# Patient Record
Sex: Female | Born: 1945 | Race: White | Hispanic: No | Marital: Married | Smoking: Former smoker
Health system: Southern US, Community
[De-identification: ages and names within clinical notes are randomized; demographics above are authoritative.]

## PROBLEM LIST (undated history)

## (undated) DIAGNOSIS — I10 Essential (primary) hypertension: Secondary | ICD-10-CM

## (undated) DIAGNOSIS — I219 Acute myocardial infarction, unspecified: Secondary | ICD-10-CM

## (undated) DIAGNOSIS — E079 Disorder of thyroid, unspecified: Secondary | ICD-10-CM

## (undated) DIAGNOSIS — I739 Peripheral vascular disease, unspecified: Secondary | ICD-10-CM

## (undated) DIAGNOSIS — I509 Heart failure, unspecified: Secondary | ICD-10-CM

## (undated) DIAGNOSIS — I639 Cerebral infarction, unspecified: Secondary | ICD-10-CM

## (undated) DIAGNOSIS — E119 Type 2 diabetes mellitus without complications: Secondary | ICD-10-CM

## (undated) DIAGNOSIS — S42309A Unspecified fracture of shaft of humerus, unspecified arm, initial encounter for closed fracture: Secondary | ICD-10-CM

## (undated) DIAGNOSIS — I2510 Atherosclerotic heart disease of native coronary artery without angina pectoris: Secondary | ICD-10-CM

## (undated) DIAGNOSIS — E785 Hyperlipidemia, unspecified: Secondary | ICD-10-CM

## (undated) DIAGNOSIS — J449 Chronic obstructive pulmonary disease, unspecified: Secondary | ICD-10-CM

## (undated) DIAGNOSIS — Z9861 Coronary angioplasty status: Secondary | ICD-10-CM

## (undated) HISTORY — PX: OTHER SURGICAL HISTORY: SHX169

## (undated) HISTORY — PX: ABDOMINAL HYSTERECTOMY: SHX81

## (undated) HISTORY — PX: BUNIONECTOMY: SHX129

## (undated) HISTORY — DX: Unspecified fracture of shaft of humerus, unspecified arm, initial encounter for closed fracture: S42.309A

## (undated) HISTORY — DX: Type 2 diabetes mellitus without complications: E11.9

## (undated) HISTORY — DX: Disorder of thyroid, unspecified: E07.9

## (undated) HISTORY — DX: Essential (primary) hypertension: I10

## (undated) HISTORY — PX: CARPAL TUNNEL RELEASE: SHX101

## (undated) HISTORY — DX: Coronary angioplasty status: Z98.61

## (undated) HISTORY — DX: Cerebral infarction, unspecified: I63.9

## (undated) HISTORY — DX: Peripheral vascular disease, unspecified: I73.9

## (undated) HISTORY — DX: Atherosclerotic heart disease of native coronary artery without angina pectoris: I25.10

## (undated) HISTORY — DX: Acute myocardial infarction, unspecified: I21.9

## (undated) HISTORY — PX: ANTERIOR CRUCIATE LIGAMENT REPAIR: SHX115

## (undated) HISTORY — DX: Hyperlipidemia, unspecified: E78.5

## (undated) HISTORY — DX: Heart failure, unspecified: I50.9

## (undated) HISTORY — DX: Chronic obstructive pulmonary disease, unspecified: J44.9

---

## 1993-10-10 DIAGNOSIS — Z9861 Coronary angioplasty status: Secondary | ICD-10-CM

## 1993-10-10 HISTORY — DX: Coronary angioplasty status: Z98.61

## 1994-04-13 DIAGNOSIS — E079 Disorder of thyroid, unspecified: Secondary | ICD-10-CM

## 1994-04-13 HISTORY — DX: Disorder of thyroid, unspecified: E07.9

## 1994-04-13 HISTORY — PX: FEMORAL BYPASS: SHX50

## 2000-01-11 DIAGNOSIS — I639 Cerebral infarction, unspecified: Secondary | ICD-10-CM

## 2000-01-11 HISTORY — DX: Cerebral infarction, unspecified: I63.9

## 2006-07-31 ENCOUNTER — Ambulatory Visit: Payer: Self-pay | Admitting: Vascular Surgery

## 2006-08-14 ENCOUNTER — Ambulatory Visit (HOSPITAL_COMMUNITY): Admission: RE | Admit: 2006-08-14 | Discharge: 2006-08-14 | Payer: Self-pay | Admitting: Vascular Surgery

## 2006-08-14 ENCOUNTER — Ambulatory Visit: Payer: Self-pay | Admitting: Vascular Surgery

## 2006-08-14 HISTORY — PX: OTHER SURGICAL HISTORY: SHX169

## 2006-09-08 ENCOUNTER — Ambulatory Visit: Payer: Self-pay | Admitting: Vascular Surgery

## 2006-09-27 ENCOUNTER — Ambulatory Visit (HOSPITAL_BASED_OUTPATIENT_CLINIC_OR_DEPARTMENT_OTHER): Admission: RE | Admit: 2006-09-27 | Discharge: 2006-09-27 | Payer: Self-pay | Admitting: Orthopedic Surgery

## 2006-12-04 ENCOUNTER — Ambulatory Visit: Payer: Self-pay | Admitting: Vascular Surgery

## 2007-04-06 ENCOUNTER — Ambulatory Visit: Payer: Self-pay | Admitting: Vascular Surgery

## 2007-10-02 ENCOUNTER — Ambulatory Visit: Payer: Self-pay | Admitting: Vascular Surgery

## 2008-03-19 ENCOUNTER — Ambulatory Visit: Payer: Self-pay | Admitting: Vascular Surgery

## 2008-09-22 ENCOUNTER — Ambulatory Visit: Payer: Self-pay | Admitting: Vascular Surgery

## 2009-03-26 ENCOUNTER — Ambulatory Visit: Payer: Self-pay | Admitting: Vascular Surgery

## 2010-03-02 ENCOUNTER — Encounter (INDEPENDENT_AMBULATORY_CARE_PROVIDER_SITE_OTHER)

## 2010-03-02 ENCOUNTER — Other Ambulatory Visit (INDEPENDENT_AMBULATORY_CARE_PROVIDER_SITE_OTHER)

## 2010-03-02 ENCOUNTER — Ambulatory Visit (INDEPENDENT_AMBULATORY_CARE_PROVIDER_SITE_OTHER): Admitting: Vascular Surgery

## 2010-03-02 DIAGNOSIS — I70219 Atherosclerosis of native arteries of extremities with intermittent claudication, unspecified extremity: Secondary | ICD-10-CM

## 2010-03-02 DIAGNOSIS — Z48812 Encounter for surgical aftercare following surgery on the circulatory system: Secondary | ICD-10-CM

## 2010-03-03 NOTE — Consult Note (Signed)
NEW PATIENT CONSULTATION  [name], [name] DOB:  [birth year]                                       03/02/2010 ZOXWR#:[id number]  The patient presents today for followup on her prior lower extremity revascularization.  She is status post right fem-pop bypass by myself in 1996 and common iliac and external iliac artery stenting by Dr. Edilia Bo in 2008.  She has upcoming left foot surgery planned and we are seeing her for further discussion of this.  She is very stable with claudication symptoms and no tissue loss.  She does walk without claudication.  PAST HISTORY:  Significant for 28 years insulin-dependent diabetes, hypertension, prior myocardial infarction 20 years ago.  PAST SURGICAL HISTORY:  Rotator cuff, bunion surgery, ACL repair, hysterectomy, carpal tunnel release in both hands.  SOCIAL HISTORY:  She is married with 1 child.  She is retired and works as a housewife.  She does smoke 1 pack of cigarettes per day.  Does not drink alcohol on a regular basis.  FAMILY HISTORY:  Negative for premature atherosclerotic disease.  REVIEW OF SYSTEMS:  Negative for weight loss or gain.  Weight is 160 pounds.  She is 5 feet 1 inches tall, otherwise negative except for HPI.  HISTORY OF PRESENT ILLNESS:  Vital signs:  Blood pressure 178/73, pulse 82, respirations 20.  She is in no acute distress.  HEENT:  Normal. Chest:  Clear bilaterally.  Heart:  Regular rate and rhythm.  Carotids are without bruits bilaterally.  She does have 2+ radial, 2+ femoral pulses.  She does have palpable dorsalis pedis pulses bilaterally. Abdomen:  Soft, nontender.  No masses noted.  Musculoskeletal:  Shows no major deformity or cyanosis.  Neurologic:  No focal weakness or paresthesias.  Skin:  Without ulcers or rashes.  She did undergo duplex evaluation in our office today.  This reveals patency of her fem-pop with no evidence of severe stenosis.  Ankle-arm index is stable at 0.94 on the  right and 0.85 on the left.  I am quite pleased with the result as is the patient and we will continue to follow her in our noninvasive vascular lab.  I do feel that she is safe to proceed with any surgery required on her left foot for the standpoint of her lower extremity arterial flow.  She will see [place] again in our vascular lab in 1 year.    Larina Earthly, M.D.  TFE/MEDQ  D:  03/02/2010  T:  03/03/2010  Job:  [id number]  cc:   Dr. Harvie Bridge, D.P.M.

## 2010-03-16 NOTE — Procedures (Unsigned)
AORTA-ILIAC DUPLEX EVALUATION  INDICATION:  Left common iliac and external iliac artery stents.  HISTORY: Diabetes:  Yes. Cardiac:  MI. Hypertension:  Yes. Smoking:  Yes. Previous Surgery:  Left common iliac and external iliac artery PTA/stents on 08/14/2006 by Dr. Edilia Bo, right fem-pop bypass graft in 1996 by Dr. Arbie Cookey.              SINGLE LEVEL ARTERIAL EXAM                             RIGHT                  LEFT Brachial:                  168                    166 Anterior tibial:           158                    143 Posterior tibial:          153                    129 Peroneal: Ankle/brachial index:      0.94                   0.85 Previous ABI/date:         03/26/2009, 0.92       03/26/2009, 0.74  AORTA-ILIAC DUPLEX EXAM Aorta - Proximal     81 cm/s Aorta - Mid          67 cm/s Aorta - Distal       74 cm/s  RIGHT                                   LEFT 207 cm/s          CIA-PROXIMAL          209 cm/s 172 cm/s          CIA-DISTAL            164 cm/s Not visualized    HYPOGASTRIC           Not visualized 171 cm/s          EIA-PROXIMAL          212 cm/s 216 cm/s          EIA-MID               169 cm/s 185 cm/s          EIA-DISTAL            167 cm/s  IMPRESSION: 1. Patent left common iliac and external iliac artery stents with     velocities of >200 cm/s noted in the bilateral common iliac and     external iliac arteries, as described above. 2. The right ankle brachial indices and biphasic Doppler waveforms     suggest a mildly decreased perfusion of the right lower extremity.     The left ankle brachial index and monophasic/biphasic Doppler     waveforms suggest mildly decreased perfusion of the left lower     extremity.  The bilateral ankle brachial indices appear stable when     compared to the previous examination.  ___________________________________________ Kristen Loader.  Early, M.D.  CH/MEDQ  D:  03/02/2010  T:  03/02/2010  Job:  [id number]

## 2010-03-16 NOTE — Procedures (Unsigned)
BYPASS GRAFT EVALUATION  INDICATION:  Right lower extremity bypass graft.  HISTORY: Diabetes:  Yes. Cardiac:  MI. Hypertension:  Yes. Smoking:  Yes. Previous Surgery:  Right fem-pop bypass graft in 1996 by Dr. Arbie Cookey, left common iliac and external iliac artery PTA/stents by Dr. Edilia Bo on 08/14/2006.  SINGLE LEVEL ARTERIAL EXAM                              RIGHT              LEFT Brachial:                    168                166 Anterior tibial:             158                143 Posterior tibial:            153                129 Peroneal: Ankle/brachial index:        0.94               0.85  PREVIOUS ABI:  Date: 03/26/2009  RIGHT:  0.92  LEFT:  0.74  LOWER EXTREMITY BYPASS GRAFT DUPLEX EXAM:  DUPLEX:  Biphasic Doppler waveforms noted throughout the right lower extremity bypass graft with a velocity of 226 cm/s noted in the right common femoral artery near the proximal anastomosis.  IMPRESSION: 1. Patent right femoropopliteal bypass graft with an elevated velocity     noted, as described above. 2. The right ankle brachial index and biphasic Doppler waveforms     suggest mildly decreased perfusion of the right lower extremity.     The left ankle brachial index and monophasic/biphasic Doppler     waveforms suggest mildly decreased perfusion of the left lower     extremity.  Bilateral ankle brachial indices appear stable when     compared to the previous examination.  ___________________________________________ Larina Earthly, M.D.  CH/MEDQ  D:  03/02/2010  T:  03/02/2010  Job:  [id number]

## 2010-05-25 NOTE — Procedures (Signed)
BYPASS GRAFT EVALUATION   INDICATION:  Follow up lower extremity  PVD.   HISTORY:  Diabetes:  Yes.  Cardiac:  Yes.  Hypertension:  Yes.  Smoking:  Yes.  Previous Surgery:  Status post right fem-pop bypass graft 04/13/1994 and  PTA/stent of left CIA and EIA 08/14/2006.   SINGLE LEVEL ARTERIAL EXAM                               RIGHT              LEFT  Brachial:                    166                150  Anterior tibial:             140                138  Posterior tibial:            162                138  Peroneal:  Ankle/brachial index:        0.92               0.83   PREVIOUS ABI:  Date: 09/08/2006  RIGHT:  0.94  LEFT:  0.84   LOWER EXTREMITY BYPASS GRAFT DUPLEX EXAM:  See attached sheet for  velocities.   DUPLEX:  Doppler  arterial-wave forms appear borderline  biphasic/triphasic proximal to within and distal to bypass graft.   IMPRESSION:  Bilateral ankle-brachial indices appear stable from  previous study.   Right femoral-popliteal bypass graft appears patent.   No significant changes from previous study.    ___________________________________________  Larina Earthly, M.D.   AS/MEDQ  D:  12/04/2006  T:  12/05/2006  Job:  [id number]

## 2010-05-25 NOTE — Op Note (Signed)
[name], [name]                ACCOUNT NO.:  [id number]   MEDICAL RECORD NO.:  [id number]          PATIENT TYPE:  AMB   LOCATION:  SDS                          FACILITY:  [facility]   PHYSICIAN:  Di Kindle. Edilia Bo, M.D.DATE OF BIRTH:  1945/10/22   DATE OF PROCEDURE:  08/14/2006  DATE OF DISCHARGE:                               OPERATIVE REPORT   PREOPERATIVE DIAGNOSIS:  Left lower extremity claudication with  peripheral vascular disease.   POSTOPERATIVE DIAGNOSIS:  Left lower extremity claudication with  peripheral vascular disease.   PROCEDURE:  1. Aortogram.  2. Bilateral iliac arteriogram.  3. Bilateral lower extremity runoff.  4. PTA and stent of the left common iliac artery and left external      iliac artery using a 8 x 60 Smart stent and then balloon dilatation      with a 7 x 40 Powerflex balloon x2.   SURGEON:  Di Kindle. Edilia Bo, M.D.   ANESTHESIA:  Local with sedation.   TECHNIQUE:  The patient was taken to the [facility] lab at [facility]. [facility] and received 1 mg of Versed and 50 mcg of fentanyl.  Both groins were prepped and draped in the usual sterile fashion.  After  the skin was infiltrated with 1% lidocaine, the left common femoral  artery was cannulated and a guidewire introduced into the infrarenal  aorta under fluoroscopic control.  A 5-French sheath was introduced over  the wire.  Pigtail catheter was then positioned at the L1 vertebral body  and flush aortogram obtained.  The catheter was then repositioned above  the aortic bifurcation and oblique iliac projections were obtained.  Bilateral lower extremity runoff films were obtained.  After successful  PTA and stenting of the left common iliac and external iliac artery,  additional runoff films were obtained on the left leg through the left  femoral sheath.  There were  no immediate postoperative complications.   On the left side, there was an 80% stenosis in the distal common iliac  artery and also a tandem 80% stenosis in the external iliac artery.  After this was identified, the 5-French sheath was exchanged for a long  6-French sheath and the patient received 3000 units of IV heparin.  An 8  x 60 Smart stent was positioned across the two tandem stenoses  incorporating both stenoses.  This was deployed.  Additional injection  was made that showed some residual stenosis within the stent.  I  therefore dilated within the stent with a 7 x 40 Powerflex balloon  proximally at 8 atmospheres for 30 seconds and then distally at 6  atmospheres for 30 seconds staying within the stent each time.  I then  did the left lower extremity runoff films through the left femoral  sheath.   FINDINGS:  There were two renal arteries on the right and then a single  renal artery on the left.  No significant renal artery stenosis is  identified.  The infrarenal aorta is widely patent.   On the right side there is some mild plaque in the proximal right common  iliac artery producing approximately 15% stenosis.  There is some mild  disease in the proximal hypogastric artery on the right.  There is mild  distal external iliac disease on the right common femoral artery disease  on the right.  The superficial femoral artery is occluded at its origin  and its reconstitution of the popliteal artery.  The fem-pop bypass  graft is widely patent with no areas of stenosis within the graft and  this is anastomosed to the below-knee popliteal artery.  The deep  femoral artery on the right is patent.  There is three-vessel runoff on  the right.  Anterior tibial, posterior tibial and peroneal arteries are  patent.   On the left side, there was a distal 80% left common iliac artery  stenosis and a proximal left external iliac artery stenosis which were  successfully ballooned and stented as described above.  The distal  external iliac and common femoral artery were widely patent.  The deep  femoral  artery on the left is patent.  There was some moderate diffuse  disease of the proximal superficial femoral artery and then the  remainder of the superficial femoral artery and popliteal artery are  patent with three-vessel runoff on the left through the anterior tibial,  posterior tibial and peroneal arteries.   CONCLUSIONS:  1. Successful percutaneous transluminal angioplasty and stenting of      the left common iliac and external iliac artery as described above.  2. Patent right fem-pop bypass graft.  3. Some moderate proximal disease of the proximal left superficial      femoral artery.      Di Kindle. Edilia Bo, M.D.  Electronically Signed     CSD/MEDQ  D:  08/14/2006  T:  08/14/2006  Job:  [id number]

## 2010-05-25 NOTE — Procedures (Signed)
AORTA-ILIAC DUPLEX EVALUATION   INDICATION:  Follow up lower extremity revascularization.   HISTORY:  Diabetes:  Yes.  Cardiac:  Yes.  Hypertension:  Yes.  Smoking:  Yes.  Previous Surgery:  Right femoral-popliteal artery bypass graft in 1996,  left CIA/EIA PTA/stent on 08/14/2006.               SINGLE LEVEL ARTERIAL EXAM                              RIGHT                  LEFT  Brachial:                  176                    178  Anterior tibial:           160                    131  Posterior tibial:          163                    123  Peroneal:  Ankle/brachial index:      0.92                   0.74  Previous ABI/date:         09/22/2008, 0.88       09/22/2008, 0.69   AORTA-ILIAC DUPLEX EXAM  Aorta - Proximal     83 cm/s  Aorta - Mid          62 cm/s  Aorta - Distal       72 cm/s   RIGHT                                   LEFT  223 cm/s          CIA-PROXIMAL          207 cm/s  213 cm/s          CIA-DISTAL            123 cm/s  136 cm/s          HYPOGASTRIC           Not visualized  267 cm/s          EIA-PROXIMAL          180 cm/s  204 cm/s          EIA-MID               230 cm/s  242 cm/s          EIA-DISTAL            171 cm/s   IMPRESSION:  1. Patent aorta and bilateral iliacs with stable elevated velocities      of >200 cm/s bilaterally.  2. Stable ankle brachial indices bilaterally.  3. No significant changes from the previous study.   ___________________________________________  Larina Earthly, M.D.   AS/MEDQ  D:  03/26/2009  T:  03/26/2009  Job:  [id number]

## 2010-05-25 NOTE — Procedures (Signed)
AORTA-ILIAC DUPLEX EVALUATION   INDICATION:  Left common iliac and external iliac PTA and stent.   HISTORY:  Diabetes:  Yes.  Cardiac:  Yes.  Hypertension:  Yes.  Smoking:  Yes.  Previous Surgery:  Please see above.               SINGLE LEVEL ARTERIAL EXAM                              RIGHT                  LEFT  Brachial:                  176                    175  Anterior tibial:           146                    121  Posterior tibial:          154                    111  Peroneal:  Ankle/brachial index:      0.88                   0.69  Previous ABI/date:         0.79                   0.62   AORTA-ILIAC DUPLEX EXAM  Aorta - Proximal     76 cm/s  Aorta - Mid          49 cm/s  Aorta - Distal       58 cm/s   RIGHT                                   LEFT  248 cm/s          CIA-PROXIMAL          169 cm/s  220 cm/s          CIA-DISTAL            190 cm/s                    HYPOGASTRIC  210 cm/s          EIA-PROXIMAL          216 cm/s  224 cm/s          EIA-MID               174 cm/s  200 cm/s          EIA-DISTAL            162 cm/s   IMPRESSION:  1. Patent left external iliac and common iliac artery stenting with no      evidence of focal stenosis.  2. Right proximal to mid external iliac appears to have increased      velocity, suggestive of greater than 50% stenosis.  3. Moderately abnormal ankle brachial index with monophasic Doppler      waveform noted in the left leg.  4. Mildly abnormal ankle brachial index with biphasic Doppler      waveforms in the right leg.   ___________________________________________  Tawanna Cooler  Shirline Frees, M.D.   MG/MEDQ  D:  09/23/2008  T:  09/23/2008  Job:  [id number]

## 2010-05-25 NOTE — Procedures (Signed)
BYPASS GRAFT EVALUATION   INDICATION:  Follow up right lower extremity bypass graft.  Claudication  has increased some; however, patient wants to wait to see a doctor until  neuro study is finished.   HISTORY:  Diabetes:  Yes.  Cardiac:  Yes.  Hypertension:  Yes.  Smoking:  Yes.  Previous Surgery:  Right femoropopliteal artery bypass graft in 1996,  left CIA/EIA PTA/stent on 08/14/06.  Patient states that had TIA in  approximately 02/10.   SINGLE LEVEL ARTERIAL EXAM                               RIGHT              LEFT  Brachial:                    173                171  Anterior tibial:             131                107  Posterior tibial:            136                104  Peroneal:  Ankle/brachial index:        0.79               0.62   PREVIOUS ABI:  Date: 10/02/07  RIGHT:  0.86  LEFT:  0.66   LOWER EXTREMITY BYPASS GRAFT DUPLEX EXAM:   DUPLEX:  Doppler arterial waveforms appear biphasic proximal to, within,  and distal to bypass graft.   IMPRESSION:  1. Patent right femoropopliteal artery bypass graft.  2. Bilateral ankle brachial indices appear fairly stable from previous      study.  3. No significant changes from previous study.   ___________________________________________  Larina Earthly, M.D.   AS/MEDQ  D:  03/19/2008  T:  03/19/2008  Job:  [id number]

## 2010-05-25 NOTE — Procedures (Signed)
AORTA-ILIAC DUPLEX EVALUATION   INDICATION:  Follow up left iliac revascularization.   HISTORY:  Diabetes:  Yes.  Cardiac:  Yes.  Hypertension:  Yes.  Smoking:  Yes.  Previous Surgery:  Right femoral-popliteal artery bypass graft in 1996,  left CIA and EIA PTA/stents, 08/14/06.               SINGLE LEVEL ARTERIAL EXAM                              RIGHT                  LEFT  Brachial:                  173                    171  Anterior tibial:           131                    107  Posterior tibial:          136                    104  Peroneal:  Ankle/brachial index:      0.79                   0.62  Previous ABI/date:         10/02/07, 0.86         10/02/07, 0.66   AORTA-ILIAC DUPLEX EXAM  Aorta - Proximal     106 cm/s  Aorta - Mid          75 cm/s  Aorta - Distal       53 cm/s   RIGHT                                   LEFT  282 cm/s          CIA-PROXIMAL          236 cm/s  159 cm/s          CIA-DISTAL            151 cm/s  161 cm/s          HYPOGASTRIC           Not visualized  170 cm/s          EIA-PROXIMAL          149 cm/s  187 cm/s          EIA-MID               204 cm/s  245 cm/s          EIA-DISTAL            150 cm/s   IMPRESSION:  1. Patent aorta and iliacs with stable elevated velocities >200 cm/s      in bilateral common iliac arteries and external iliac arteries.  2. Fairly stable ankle brachial indices.  3. No significant changes from previous study.       ___________________________________________  Larina Earthly, M.D.   AS/MEDQ  D:  03/19/2008  T:  03/19/2008  Job:  [id number]

## 2010-05-25 NOTE — Procedures (Signed)
BYPASS GRAFT EVALUATION   INDICATION:  Follow up lower extremity revascularization.   HISTORY:  Diabetes:  Yes.  Cardiac:  Yes.  Hypertension:  Yes.  Smoking:  Yes.  Previous Surgery:  Left CIA/EIA PTA/stent on 08/14/2006.   SINGLE LEVEL ARTERIAL EXAM                               RIGHT              LEFT  Brachial:                    176                178  Anterior tibial:             160                131  Posterior tibial:            163                123  Peroneal:  Ankle/brachial index:        0.92               0.74   PREVIOUS ABI:  Date: 09/22/2008  RIGHT:  0.88  LEFT:  0.69   LOWER EXTREMITY BYPASS GRAFT DUPLEX EXAM:   DUPLEX:  1. Doppler arterial waveforms appear biphasic proximal to, within, and      distal to the right lower extremity bypass graft.  2. Stable elevated velocities of >200 cm/s at the inflow and proximal      anastomosis.   IMPRESSION:  1. Patent right femoral-popliteal artery bypass graft.  2. Stable elevated velocities at the right inflow and proximal      anastomosis.  3. Stable ankle brachial indices bilaterally.  4. No significant changes from previous study.   ___________________________________________  Larina Earthly, M.D.   AS/MEDQ  D:  03/26/2009  T:  03/26/2009  Job:  [id number]

## 2010-05-25 NOTE — Assessment & Plan Note (Signed)
OFFICE VISIT   [name], [name]  DOB:  07-15-1945                                       09/08/2006  ZOXWR#:[id number]   SUBJECTIVE:  The patient presents today for followup of her recent  aortogram with runoff and common and left external iliac artery,  angioplasty and Smart stent placement.  She has had an excellent initial  result.  She reports that she is walking without any claudication  symptoms.  She does have some specific pain in her left knee due to  meniscus injury and she is going to address this.   OBJECTIVE:  Her left groin puncture site is without evidence of hematoma  or false aneurysm.  She does have palpable femoral and palpable dorsalis  pedis pulses bilaterally.   PROCEDURE:  She underwent noninvasive vascular laboratory studies today  and this reveals improvement on the left from 0.63 up to 0.84.   ASSESSMENT:  I am quite pleased with her initial response.   PLAN:  She will continue her walking program and we will see her again  in 1 year with repeat ankle/arm index.   Larina Earthly, M.D.  Electronically Signed   TFE/MEDQ  D:  09/08/2006  T:  09/12/2006  Job:  [id number]   cc:   Judeth Cornfield. Edilia Bo, M.D.

## 2010-05-25 NOTE — Procedures (Signed)
BYPASS GRAFT EVALUATION   INDICATION:  Follow up right fem-pop bypass graft and left common iliac  and external iliac PTA and stent.   HISTORY:  Diabetes:  Yes.  Cardiac:  Yes.  Hypertension:  Yes.  Smoking:  Yes.  Previous Surgery:  Please see above.   SINGLE LEVEL ARTERIAL EXAM                               RIGHT              LEFT  Brachial:                    176                175  Anterior tibial:             146                121  Posterior tibial:            154                111  Peroneal:  Ankle/brachial index:        0.88               0.69   PREVIOUS ABI:  Date: 03/19/08  RIGHT:  0.79  LEFT:  0.62   LOWER EXTREMITY BYPASS GRAFT DUPLEX EXAM:   DUPLEX:  Patent right fem-pop bypass graft with no evidence of focal  stenosis.   IMPRESSION:  1. Patent right femoropopliteal bypass graft with no evidence of focal      stenosis.  2. Mildly abnormal ankle brachial index with biphasic Doppler waveform      noted in the right leg.  3. Moderately abnormal ankle brachial index with monophasic Doppler      waveform noted in the left leg.  4. Status post left standard iliac and common iliac artery posterior      tibial artery and stent.   ___________________________________________  Larina Earthly, M.D.   MG/MEDQ  D:  09/23/2008  T:  09/23/2008  Job:  [id number]

## 2010-05-25 NOTE — Procedures (Signed)
BYPASS GRAFT EVALUATION   INDICATION:  Followup right leg bypass graft.   HISTORY:  Diabetes:  Yes.  Cardiac:  Yes.  Hypertension:  Yes.  Smoking:  Yes, 1 pack per day.  Previous Surgery:  Right femoral-to-popliteal artery bypass graft with  translocated non-reversed saphenous vein on 04/13/1994, PTA and stent  left common iliac and external iliac arteries 08/14/2006, both by Dr.  Arbie Cookey.   SINGLE LEVEL ARTERIAL EXAM                               RIGHT              LEFT  Brachial:                    144                144  Anterior tibial:             124                110  Posterior tibial:            130                94  Peroneal:  Ankle/brachial index:        0.90               0.76   PREVIOUS ABI:  Date: 12/04/2006  RIGHT:  0.92  LEFT:  0.83   LOWER EXTREMITY BYPASS GRAFT DUPLEX EXAM:   DUPLEX:  Doppler arterial waveforms are biphasic proximal to, throughout  and distal to the graft.   IMPRESSION:  1. Patent right femoral-to-popliteal artery bypass graft.  2. ABIs are stable bilaterally.      ___________________________________________  Larina Earthly, M.D.   DP/MEDQ  D:  04/06/2007  T:  04/06/2007  Job:  [id number]

## 2010-05-25 NOTE — Assessment & Plan Note (Signed)
OFFICE VISIT   [name], [name]  DOB:  1945-09-28                                       07/31/2006  EAVWU#:[id number]   The patient is in today for evaluation of left leg claudication.  She is  well known to me from a prior right femoral to popliteal bypass 12 years  ago in April of 1996, this was with a translocated nonreversed saphenous  vein.  She has had no difficulty with her bypass since that time.  She  is quite active at her age of 65.  Works extensively with horses and the  outdoors and has had progressive difficulty with walking secondary to  this.  She does not have any rest pain but reports inability to walk  quickly.  She is a non-insulin diabetic, has elevated blood pressure,  elevated cholesterol, has had a prior myocardial infarction 19 years  ago.  She is married with one child, she is retired.  Unfortunately she  does continue to smoke one pack of cigarettes per day.   REVIEW OF SYSTEMS:  Otherwise negative except for her leg pain with  walking.   PHYSICAL EXAMINATION:  Well-developed, well-nourished white female  presents at age 23.  Blood pressure 135/80, pulse 79, respirations 16.  She has 2+ radial pulses bilaterally, her carotid arteries have bruits  bilaterally, she has 2+ femoral pulses bilaterally.  She has easily  palpable right popliteal pulse and dorsalis pedis pulse.  I did not feel  popliteal or distal pulses on the left.   She had undergone non-evasive vascular laboratory studies in [place],  revealing an ankle-arm index of 0.73 on the left, 0.76 on the right.  I  discussed options with the patient.  She is unable to tolerate this  level of claudication, is quite active.  She wished to proceed with  arteriography for further evaluation.  We will schedule this for August  5 at [facility] with Dr. Cari Caraway.  We will make further recommendations regarding left fem-pop  bypass pending this study.   Larina Earthly, M.D.  Electronically Signed   TFE/MEDQ  D:  07/31/2006  T:  08/01/2006  Job:  [id number]   cc:   Keturah Barre, MD  Di Kindle. Edilia Bo, M.D.

## 2010-05-25 NOTE — Procedures (Signed)
AORTA-ILIAC DUPLEX EVALUATION   INDICATION:  Followup left common iliac and external iliac artery PTA  and stent.   HISTORY:  Diabetes:  Yes.  Cardiac:  Yes, patient has had cardiac angioplasty.  Hypertension:  Yes.  Smoking:  Yes, 1 pack per day.  Previous Surgery:  Left common iliac PTA and stent 08/14/2006, right  femoral-to-popliteal artery bypass graft with translocated nonreversed  saphenous vein on 04/13/1994, both by Dr. Arbie Cookey.               SINGLE LEVEL ARTERIAL EXAM                              RIGHT                  LEFT  Brachial:                  144                    144  Anterior tibial:           120                    110  Posterior tibial:          130                    94  Peroneal:  Ankle/brachial index:      0.90                   0.76  Previous ABI/date:         12/04/2006, 0.92       12/04/2006, 0.83   AORTA-ILIAC DUPLEX EXAM  Aorta - Proximal     68 cm/s  Aorta - Mid          66 cm/s  Aorta - Distal       129 cm/s   RIGHT                                   LEFT  71 cm/s           CIA-PROXIMAL          91 cm/s  161 cm/s          CIA-DISTAL            129 cm/s                    HYPOGASTRIC  188 cm/s          EIA-PROXIMAL          141 cm/s                    EIA-MID                    EIA-DISTAL            164 cm/s   IMPRESSION:  Patent left common iliac and external iliac artery PTA and  stents.  ABIs are stable bilaterally.   ___________________________________________  Larina Earthly, M.D.   DP/MEDQ  D:  04/06/2007  T:  04/06/2007  Job:  [id number]

## 2010-05-25 NOTE — Procedures (Signed)
BYPASS GRAFT EVALUATION   INDICATION:  Follow up right lower extremity bypass graft and left iliac  artery stents.   HISTORY:  Diabetes:  Yes.  Cardiac:  Yes.  Hypertension:  Yes.  Smoking:  Yes.  Previous Surgery:  Right fem-pop bypass graft in 1996, left common iliac  and external iliac artery PTA and stents on 08/14/06.   SINGLE LEVEL ARTERIAL EXAM                               RIGHT              LEFT  Brachial:                    152                152  Anterior tibial:             128                100  Posterior tibial:            130                92  Peroneal:  Ankle/brachial index:        0.86               0.66   PREVIOUS ABI:  Date: 04/06/07  RIGHT:  0.9  LEFT:  0.76   LOWER EXTREMITY BYPASS GRAFT DUPLEX EXAM:   DUPLEX:  1. Patent aorta, bilateral common iliac and external iliac arteries      with elevated velocities of 292 cm/s in the right proximal common      iliac artery, 307 cm/s in the right distal external iliac artery,      213 cm/s in the left proximal common iliac artery and 241 cm/s in      the left distal external iliac artery.  2. Biphasic Doppler waveforms noted through the right lower extremity      bypass graft and its native vessels with no increase in velocities      noted.   IMPRESSION:  1. Patent bilateral common iliac and external iliac arteries with      increased velocities noted, as described above and on the attached      work sheet.  2. Patent right femoropopliteal bypass graft with no evidence of      stenosis noted.  3. No significant change noted in the bilateral ankle brachial indices      when compared to the previous examination on 04/06/07.   ___________________________________________  Larina Earthly, M.D.   CH/MEDQ  D:  10/02/2007  T:  10/02/2007  Job:  [id number]

## 2010-10-21 LAB — I-STAT 8, (EC8 V) (CONVERTED LAB)
Acid-Base Excess: 1
TCO2: 26
pCO2, Ven: 37.4 — ABNORMAL LOW
pH, Ven: 7.429 — ABNORMAL HIGH

## 2010-10-25 LAB — COMPREHENSIVE METABOLIC PANEL
Albumin: 3.2 — ABNORMAL LOW
BUN: 16
Chloride: 102
Creatinine, Ser: 0.84
Glucose, Bld: 179 — ABNORMAL HIGH
Total Bilirubin: 0.4
Total Protein: 6.5

## 2010-10-25 LAB — CBC
HCT: 42.3
Hemoglobin: 14.4
MCHC: 34.2
MCV: 95.8
Platelets: 318
RBC: 4.41
RDW: 12.9
WBC: 10.2

## 2010-10-25 LAB — PROTIME-INR
INR: 0.9
Prothrombin Time: 12.3

## 2010-10-25 LAB — APTT: aPTT: 34

## 2011-03-08 ENCOUNTER — Encounter (INDEPENDENT_AMBULATORY_CARE_PROVIDER_SITE_OTHER): Admitting: *Deleted

## 2011-03-08 DIAGNOSIS — I70219 Atherosclerosis of native arteries of extremities with intermittent claudication, unspecified extremity: Secondary | ICD-10-CM

## 2011-03-08 DIAGNOSIS — Z48812 Encounter for surgical aftercare following surgery on the circulatory system: Secondary | ICD-10-CM

## 2011-03-15 NOTE — Procedures (Unsigned)
AORTA-ILIAC DUPLEX EVALUATION      INDICATION:  Left common iliac artery stent  HISTORY: Diabetes:  Yes Cardiac:  MI Hypertension:  Ye Smoking:  Yes Previous Surgery:  Right fem-pop bypass graft in 1996, left common iliac and external iliac artery PTA and stents on 08/14/2006                     AORTOILIAC DUPLEX EVALUATION TABLE:                                   RIGHT:                    LEFT: BRACHIAL : ANTERIOR TIBIAL : POSTERIOR TIBIAL : PERONEAL : ANKLE BRACHIAL INDEX : PREVIOUS ABI DATE :    AORTA PROXIMAL:  66 cm/s AORTA MID:  53 cm/s AORTA DISTAL:  Not visualized        RIGHT:                                                   LEFT: Not Visualized              CIA PROXIMAL                 Not Visualized Not Visualized              CIA DISTAL                   Not Visualized Not Visualized              HYPOGASTRIC                  Not Visualized 171                         EIA PROXIMAL                 190 189                         EIA MID                      126 181                         EIA DISTAL                   154    IMPRESSION: 1. The distal abdominal aorta and bilateral common iliac arteries were     not adequately visualized due to overlying bowel gas patterns. 2. Biphasic Doppler waveforms noted throughout the bilateral external     iliac arteries, with velocities as described above. 3. Bilateral ankle-brachial indices are noted on a separate report.  ___________________________________________ Larina Earthly, M.D.  CH/MEDQ  D:  03/10/2011  T:  03/10/2011  Job:  [id number]

## 2011-04-01 ENCOUNTER — Encounter: Payer: Self-pay | Admitting: Vascular Surgery

## 2011-04-01 ENCOUNTER — Other Ambulatory Visit: Payer: Self-pay | Admitting: *Deleted

## 2011-04-01 DIAGNOSIS — Z48812 Encounter for surgical aftercare following surgery on the circulatory system: Secondary | ICD-10-CM

## 2011-04-01 DIAGNOSIS — I70219 Atherosclerosis of native arteries of extremities with intermittent claudication, unspecified extremity: Secondary | ICD-10-CM

## 2011-04-01 NOTE — Procedures (Unsigned)
BYPASS GRAFT EVALUATION  INDICATION:  Right lower extremity bypass graft.  HISTORY: Diabetes:  Yes. Cardiac:  MI. Hypertension:  Yes. Smoking:  Yes. Previous Surgery:  Right fem-pop bypass graft in 1996, left common iliac and external iliac artery PTA and stents on 08/14/2006.  SINGLE LEVEL ARTERIAL EXAM                              RIGHT              LEFT Brachial: Anterior tibial: Posterior tibial: Peroneal: Ankle/brachial index:  PREVIOUS ABI:  Date:  RIGHT:  LEFT:  LOWER EXTREMITY BYPASS GRAFT DUPLEX EXAM:  DUPLEX:  Biphasic Doppler waveforms noted throughout the right lower extremity bypass graft.  Velocities up to 202 cm/s and 217 cm/s noted in the right proximal anastomosis and distal native vessel.  IMPRESSION: 1. Patent right femoral-popliteal bypass graft with elevated     velocities as described above. 2. Bilateral ankle brachial indices are noted on a separate report.     ___________________________________________ Larina Earthly, M.D.  CH/MEDQ  D:  03/10/2011  T:  03/10/2011  Job:  [id number]

## 2012-01-10 ENCOUNTER — Encounter: Payer: Self-pay | Admitting: Vascular Surgery

## 2012-03-29 ENCOUNTER — Encounter

## 2012-03-29 ENCOUNTER — Other Ambulatory Visit

## 2012-03-29 ENCOUNTER — Ambulatory Visit: Admitting: Neurosurgery

## 2012-06-12 ENCOUNTER — Ambulatory Visit: Admitting: Vascular Surgery

## 2012-06-12 ENCOUNTER — Encounter

## 2012-06-12 ENCOUNTER — Other Ambulatory Visit

## 2012-07-10 DIAGNOSIS — S42309A Unspecified fracture of shaft of humerus, unspecified arm, initial encounter for closed fracture: Secondary | ICD-10-CM

## 2012-07-10 HISTORY — DX: Unspecified fracture of shaft of humerus, unspecified arm, initial encounter for closed fracture: S42.309A

## 2012-08-17 ENCOUNTER — Other Ambulatory Visit: Payer: Self-pay | Admitting: *Deleted

## 2012-08-17 DIAGNOSIS — Z48812 Encounter for surgical aftercare following surgery on the circulatory system: Secondary | ICD-10-CM

## 2012-08-17 DIAGNOSIS — I739 Peripheral vascular disease, unspecified: Secondary | ICD-10-CM

## 2012-09-03 ENCOUNTER — Encounter: Payer: Self-pay | Admitting: Vascular Surgery

## 2012-09-04 ENCOUNTER — Encounter

## 2012-09-04 ENCOUNTER — Encounter: Payer: Self-pay | Admitting: Vascular Surgery

## 2012-09-04 ENCOUNTER — Other Ambulatory Visit (INDEPENDENT_AMBULATORY_CARE_PROVIDER_SITE_OTHER): Payer: Medicare Other | Admitting: *Deleted

## 2012-09-04 ENCOUNTER — Ambulatory Visit: Admitting: Vascular Surgery

## 2012-09-04 ENCOUNTER — Encounter (INDEPENDENT_AMBULATORY_CARE_PROVIDER_SITE_OTHER): Payer: Medicare Other | Admitting: *Deleted

## 2012-09-04 ENCOUNTER — Other Ambulatory Visit

## 2012-09-04 ENCOUNTER — Ambulatory Visit (INDEPENDENT_AMBULATORY_CARE_PROVIDER_SITE_OTHER): Payer: Medicare Other | Admitting: Vascular Surgery

## 2012-09-04 VITALS — BP 175/67 | HR 76 | Resp 18 | Ht 64.0 in | Wt 140.9 lb

## 2012-09-04 DIAGNOSIS — Z48812 Encounter for surgical aftercare following surgery on the circulatory system: Secondary | ICD-10-CM

## 2012-09-04 DIAGNOSIS — I739 Peripheral vascular disease, unspecified: Secondary | ICD-10-CM | POA: Insufficient documentation

## 2012-09-04 NOTE — Progress Notes (Signed)
The patient presents today for followup of her lower surety arterial disease. She is status post right femoral to below knee popliteal artery bypass with saphenous vein by myself 18 years ago in 1996. She has never had any reinnervation of this. She also status post left common and external iliac artery angioplasty and stenting with Dr. Edilia Bo in 2008. She reports that she is walking with no claudication symptoms and remains quite active. She did have death of her husband after a prolonged with cancer 2 years ago. She denies any new cardiac difficulties.  Past Medical History  Diagnosis Date  . Diabetes mellitus without complication   . Hypertension   . Hyperlipidemia   . Peripheral vascular disease   . Peripheral arterial disease   . Heart attack 1989, 1992  . Post PTCA Oct. 1995  . Thyroid disease 04/13/94  . Stroke   . Fracture closed, humerus July 2014    right humerus and right scapula    History  Substance Use Topics  . Smoking status: Current Every Day Smoker -- 1.00 packs/day    Types: Cigarettes  . Smokeless tobacco: Never Used  . Alcohol Use: No    Family History  Problem Relation Age of Onset  . Adopted: Yes    Allergies  Allergen Reactions  . Lyrica [Pregabalin]     Chest pain, difficulty breathing    Current outpatient prescriptions:amLODipine-benazepril (LOTREL) 5-20 MG per capsule, Take 1 capsule by mouth daily., Disp: , Rfl: ;  aspirin 81 MG tablet, Take 81 mg by mouth daily., Disp: , Rfl: ;  atenolol (TENORMIN) 50 MG tablet, Take 50 mg by mouth daily., Disp: , Rfl: ;  clopidogrel (PLAVIX) 75 MG tablet, Take 75 mg by mouth daily., Disp: , Rfl: ;  diazepam (VALIUM) 2 MG tablet, Take 2 mg by mouth every 6 (six) hours as needed., Disp: , Rfl:  DULoxetine (CYMBALTA) 60 MG capsule, Take 60 mg by mouth daily., Disp: , Rfl: ;  esomeprazole (NEXIUM) 40 MG capsule, Take 40 mg by mouth daily before breakfast., Disp: , Rfl: ;  Eszopiclone (ESZOPICLONE) 3 MG TABS, Take 3 mg by  mouth at bedtime. Take immediately before bedtime, Disp: , Rfl: ;  ezetimibe (ZETIA) 10 MG tablet, Take 10 mg by mouth daily., Disp: , Rfl: ;  Glucose Blood (PRECISION QID TEST VI), by In Vitro route., Disp: , Rfl:  Insulin Glargine (LANTUS SOLOSTAR Tees Toh), Inject 60 Units into the skin 2 (two) times daily., Disp: , Rfl: ;  Lancets MISC, by Does not apply route., Disp: , Rfl: ;  levothyroxine (SYNTHROID, LEVOTHROID) 125 MCG tablet, Take 125 mcg by mouth daily., Disp: , Rfl: ;  Liraglutide (VICTOZA Pepin), Inject 1.2 mg into the skin., Disp: , Rfl: ;  metFORMIN (GLUCOPHAGE) 1000 MG tablet, Take 1,000 mg by mouth 2 (two) times daily with a meal., Disp: , Rfl:  nitroGLYCERIN (NITROSTAT) 0.6 MG SL tablet, Place 0.6 mg under the tongue every 5 (five) minutes as needed for chest pain., Disp: , Rfl: ;  rOPINIRole (REQUIP) 2 MG tablet, Take 2 mg by mouth at bedtime., Disp: , Rfl: ;  tizanidine (ZANAFLEX) 6 MG capsule, Take 6 mg by mouth 3 (three) times daily., Disp: , Rfl: ;  buPROPion (WELLBUTRIN SR) 150 MG 12 hr tablet, Take 150 mg by mouth 2 (two) times daily., Disp: , Rfl:  rosuvastatin (CRESTOR) 10 MG tablet, Take 10 mg by mouth daily., Disp: , Rfl:   BP 175/67  Pulse 76  Resp 18  Ht 5\' 4"  (1.626 m)  Wt 140 lb 14.4 oz (63.912 kg)  BMI 24.17 kg/m2  Body mass index is 24.17 kg/(m^2).       Physical exam: Well-developed well-nourished female in no acute distress Pulse status 2+ radial and 2+ dorsalis pedis pulses bilaterally. She has an easily palpable right popliteal graft pulse Carotid arteries without bruits bilaterally Respirations are nonlabored Musculoskeletal does have some limited range of motion in her right shoulder from a recent fall with fracture.  Lower surety arterial exam reveals ankle arm index of 0.89 on the right and 0.78 on the left with monophasic waveforms on the left duplex of her bypass shows a widely patent biphasic flow she does have biphasic flow at the left posterior tibial  and anterior tibial arteries.  Impression and plan: Stable lower extremity arterial flow appeared. Continue her walking program and yearly surveillance protocol

## 2012-09-05 NOTE — Addendum Note (Signed)
Addended by: Sharee Pimple on: 09/05/2012 11:06 AM   Modules accepted: Orders

## 2013-05-10 HISTORY — PX: UPPER GI ENDOSCOPY: SHX6162

## 2013-05-15 HISTORY — PX: OTHER SURGICAL HISTORY: SHX169

## 2013-08-22 ENCOUNTER — Other Ambulatory Visit: Payer: Self-pay | Admitting: *Deleted

## 2013-08-22 DIAGNOSIS — Z48812 Encounter for surgical aftercare following surgery on the circulatory system: Secondary | ICD-10-CM

## 2013-08-22 DIAGNOSIS — I739 Peripheral vascular disease, unspecified: Secondary | ICD-10-CM

## 2013-09-09 ENCOUNTER — Encounter: Payer: Self-pay | Admitting: Family

## 2013-09-10 ENCOUNTER — Encounter: Payer: Self-pay | Admitting: Family

## 2013-09-10 ENCOUNTER — Ambulatory Visit (HOSPITAL_COMMUNITY)
Admission: RE | Admit: 2013-09-10 | Discharge: 2013-09-10 | Disposition: A | Discharge status: Home / Self Care | Payer: Medicare Other | Source: Ambulatory Visit | Attending: Family | Admitting: Family

## 2013-09-10 ENCOUNTER — Ambulatory Visit (INDEPENDENT_AMBULATORY_CARE_PROVIDER_SITE_OTHER)
Admission: RE | Admit: 2013-09-10 | Discharge: 2013-09-10 | Disposition: A | Discharge status: Home / Self Care | Payer: Medicare Other | Source: Ambulatory Visit | Attending: Family | Admitting: Family

## 2013-09-10 ENCOUNTER — Ambulatory Visit (INDEPENDENT_AMBULATORY_CARE_PROVIDER_SITE_OTHER): Payer: Medicare Other | Admitting: Family

## 2013-09-10 VITALS — BP 161/77 | HR 75 | Resp 16 | Ht 61.0 in | Wt 161.0 lb

## 2013-09-10 DIAGNOSIS — Z48812 Encounter for surgical aftercare following surgery on the circulatory system: Secondary | ICD-10-CM

## 2013-09-10 DIAGNOSIS — Z87891 Personal history of nicotine dependence: Secondary | ICD-10-CM | POA: Insufficient documentation

## 2013-09-10 DIAGNOSIS — I10 Essential (primary) hypertension: Secondary | ICD-10-CM | POA: Insufficient documentation

## 2013-09-10 DIAGNOSIS — Z7901 Long term (current) use of anticoagulants: Secondary | ICD-10-CM | POA: Insufficient documentation

## 2013-09-10 DIAGNOSIS — Z7982 Long term (current) use of aspirin: Secondary | ICD-10-CM | POA: Insufficient documentation

## 2013-09-10 DIAGNOSIS — E785 Hyperlipidemia, unspecified: Secondary | ICD-10-CM | POA: Insufficient documentation

## 2013-09-10 DIAGNOSIS — Z9861 Coronary angioplasty status: Secondary | ICD-10-CM | POA: Insufficient documentation

## 2013-09-10 DIAGNOSIS — I2510 Atherosclerotic heart disease of native coronary artery without angina pectoris: Secondary | ICD-10-CM | POA: Insufficient documentation

## 2013-09-10 DIAGNOSIS — J4489 Other specified chronic obstructive pulmonary disease: Secondary | ICD-10-CM | POA: Insufficient documentation

## 2013-09-10 DIAGNOSIS — I739 Peripheral vascular disease, unspecified: Secondary | ICD-10-CM

## 2013-09-10 DIAGNOSIS — I509 Heart failure, unspecified: Secondary | ICD-10-CM | POA: Insufficient documentation

## 2013-09-10 DIAGNOSIS — R29898 Other symptoms and signs involving the musculoskeletal system: Secondary | ICD-10-CM

## 2013-09-10 DIAGNOSIS — Z951 Presence of aortocoronary bypass graft: Secondary | ICD-10-CM | POA: Insufficient documentation

## 2013-09-10 DIAGNOSIS — E119 Type 2 diabetes mellitus without complications: Secondary | ICD-10-CM | POA: Insufficient documentation

## 2013-09-10 DIAGNOSIS — Z794 Long term (current) use of insulin: Secondary | ICD-10-CM | POA: Insufficient documentation

## 2013-09-10 DIAGNOSIS — I252 Old myocardial infarction: Secondary | ICD-10-CM | POA: Insufficient documentation

## 2013-09-10 DIAGNOSIS — I70209 Unspecified atherosclerosis of native arteries of extremities, unspecified extremity: Secondary | ICD-10-CM | POA: Insufficient documentation

## 2013-09-10 DIAGNOSIS — R209 Unspecified disturbances of skin sensation: Secondary | ICD-10-CM

## 2013-09-10 DIAGNOSIS — J449 Chronic obstructive pulmonary disease, unspecified: Secondary | ICD-10-CM | POA: Diagnosis not present

## 2013-09-10 DIAGNOSIS — R202 Paresthesia of skin: Secondary | ICD-10-CM

## 2013-09-10 NOTE — Patient Instructions (Addendum)
Peripheral Vascular Disease Peripheral Vascular Disease (PVD), also called Peripheral Arterial Disease (PAD), is a circulation problem caused by cholesterol (atherosclerotic plaque) deposits in the arteries. PVD commonly occurs in the lower extremities (legs) but it can occur in other areas of the body, such as your arms. The cholesterol buildup in the arteries reduces blood flow which can cause pain and other serious problems. The presence of PVD can place a person at risk for Coronary Artery Disease (CAD).  CAUSES  Causes of PVD can be many. It is usually associated with more than one risk factor such as:   High Cholesterol.  Smoking.  Diabetes.  Lack of exercise or inactivity.  High blood pressure (hypertension).  Obesity.  Family history. SYMPTOMS   When the lower extremities are affected, patients with PVD may experience:  Leg pain with exertion or physical activity. This is called INTERMITTENT CLAUDICATION. This may present as cramping or numbness with physical activity. The location of the pain is associated with the level of blockage. For example, blockage at the abdominal level (distal abdominal aorta) may result in buttock or hip pain. Lower leg arterial blockage may result in calf pain.  As PVD becomes more severe, pain can develop with less physical activity.  In people with severe PVD, leg pain may occur at rest.  Other PVD signs and symptoms:  Leg numbness or weakness.  Coldness in the affected leg or foot, especially when compared to the other leg.  A change in leg color.  Patients with significant PVD are more prone to ulcers or sores on toes, feet or legs. These may take longer to heal or may reoccur. The ulcers or sores can become infected.  If signs and symptoms of PVD are ignored, gangrene may occur. This can result in the loss of toes or loss of an entire limb.  Not all leg pain is related to PVD. Other medical conditions can cause leg pain such  as:  Blood clots (embolism) or Deep Vein Thrombosis.  Inflammation of the blood vessels (vasculitis).  Spinal stenosis. DIAGNOSIS  Diagnosis of PVD can involve several different types of tests. These can include:  Pulse Volume Recording Method (PVR). This test is simple, painless and does not involve the use of X-rays. PVR involves measuring and comparing the blood pressure in the arms and legs. An ABI (Ankle-Brachial Index) is calculated. The normal ratio of blood pressures is 1. As this number becomes smaller, it indicates more severe disease.  < 0.95 - indicates significant narrowing in one or more leg vessels.  <0.8 - there will usually be pain in the foot, leg or buttock with exercise.  <0.4 - will usually have pain in the legs at rest.  <0.25 - usually indicates limb threatening PVD.  Doppler detection of pulses in the legs. This test is painless and checks to see if you have a pulses in your legs/feet.  A dye or contrast material (a substance that highlights the blood vessels so they show up on x-ray) may be given to help your caregiver better see the arteries for the following tests. The dye is eliminated from your body by the kidney's. Your caregiver may order blood work to check your kidney function and other laboratory values before the following tests are performed:  Magnetic Resonance Angiography (MRA). An MRA is a picture study of the blood vessels and arteries. The MRA machine uses a large magnet to produce images of the blood vessels.  Computed Tomography Angiography (CTA). A CTA   is a specialized x-ray that looks at how the blood flows in your blood vessels. An IV may be inserted into your arm so contrast dye can be injected.  Angiogram. Is a procedure that uses x-rays to look at your blood vessels. This procedure is minimally invasive, meaning a small incision (cut) is made in your groin. A small tube (catheter) is then inserted into the artery of your groin. The catheter  is guided to the blood vessel or artery your caregiver wants to examine. Contrast dye is injected into the catheter. X-rays are then taken of the blood vessel or artery. After the images are obtained, the catheter is taken out. TREATMENT  Treatment of PVD involves many interventions which may include:  Lifestyle changes:  Quitting smoking.  Exercise.  Following a low fat, low cholesterol diet.  Control of diabetes.  Foot care is very important to the PVD patient. Good foot care can help prevent infection.  Medication:  Cholesterol-lowering medicine.  Blood pressure medicine.  Anti-platelet drugs.  Certain medicines may reduce symptoms of Intermittent Claudication.  Interventional/Surgical options:  Angioplasty. An Angioplasty is a procedure that inflates a balloon in the blocked artery. This opens the blocked artery to improve blood flow.  Stent Implant. A wire mesh tube (stent) is placed in the artery. The stent expands and stays in place, allowing the artery to remain open.  Peripheral Bypass Surgery. This is a surgical procedure that reroutes the blood around a blocked artery to help improve blood flow. This type of procedure may be performed if Angioplasty or stent implants are not an option. SEEK IMMEDIATE MEDICAL CARE IF:   You develop pain or numbness in your arms or legs.  Your arm or leg turns cold, becomes blue in color.  You develop redness, warmth, swelling and pain in your arms or legs. MAKE SURE YOU:   Understand these instructions.  Will watch your condition.  Will get help right away if you are not doing well or get worse. Document Released: 02/04/2004 Document Revised: 03/21/2011 Document Reviewed: 01/01/2008 Exit[employer] Patient Information 2015 [employer]. This information is not intended to replace advice given to you by your health care provider. Make sure you discuss any questions you have with your health care provider.   Stroke  Prevention Some medical conditions and behaviors are associated with an increased chance of having a stroke. You may prevent a stroke by making healthy choices and managing medical conditions. HOW CAN I REDUCE MY RISK OF HAVING A STROKE?   Stay physically active. Get at least 30 minutes of activity on most or all days.  Do not smoke. It may also be helpful to avoid exposure to secondhand smoke.  Limit alcohol use. Moderate alcohol use is considered to be:  No more than 2 drinks per day for men.  No more than 1 drink per day for nonpregnant women.  Eat healthy foods. This involves:  Eating 5 or more servings of fruits and vegetables a day.  Making dietary changes that address high blood pressure (hypertension), high cholesterol, diabetes, or obesity.  Manage your cholesterol levels.  Making food choices that are high in fiber and low in saturated fat, trans fat, and cholesterol may control cholesterol levels.  Take any prescribed medicines to control cholesterol as directed by your health care provider.  Manage your diabetes.  Controlling your carbohydrate and sugar intake is recommended to manage diabetes.  Take any prescribed medicines to control diabetes as directed by your health care provider.    Control your hypertension.  Making food choices that are low in salt (sodium), saturated fat, trans fat, and cholesterol is recommended to manage hypertension.  Take any prescribed medicines to control hypertension as directed by your health care provider.  Maintain a healthy weight.  Reducing calorie intake and making food choices that are low in sodium, saturated fat, trans fat, and cholesterol are recommended to manage weight.  Stop drug abuse.  Avoid taking birth control pills.  Talk to your health care provider about the risks of taking birth control pills if you are over 35 years old, smoke, get migraines, or have ever had a blood clot.  Get evaluated for sleep  disorders (sleep apnea).  Talk to your health care provider about getting a sleep evaluation if you snore a lot or have excessive sleepiness.  Take medicines only as directed by your health care provider.  For some people, aspirin or blood thinners (anticoagulants) are helpful in reducing the risk of forming abnormal blood clots that can lead to stroke. If you have the irregular heart rhythm of atrial fibrillation, you should be on a blood thinner unless there is a good reason you cannot take them.  Understand all your medicine instructions.  Make sure that other conditions (such as anemia or atherosclerosis) are addressed. SEEK IMMEDIATE MEDICAL CARE IF:   You have sudden weakness or numbness of the face, arm, or leg, especially on one side of the body.  Your face or eyelid droops to one side.  You have sudden confusion.  You have trouble speaking (aphasia) or understanding.  You have sudden trouble seeing in one or both eyes.  You have sudden trouble walking.  You have dizziness.  You have a loss of balance or coordination.  You have a sudden, severe headache with no known cause.  You have new chest pain or an irregular heartbeat. Any of these symptoms may represent a serious problem that is an emergency. Do not wait to see if the symptoms will go away. Get medical help at once. Call your local emergency services (911 in [place] Do not drive yourself to the hospital. Document Released: 02/04/2004 Document Revised: 05/13/2013 Document Reviewed: 06/29/2012 [employer] Patient Information 2015 [employer]. This information is not intended to replace advice given to you by your health care provider. Make sure you discuss any questions you have with your health care provider.  

## 2013-09-10 NOTE — Progress Notes (Signed)
[facility] [place] HISTORY AND PHYSICAL -PAD  History of Present Illness [name] is a 68 y.o. female patient of Dr. Arbie Cookey who is status post right femoral to below knee popliteal artery bypass with sapheTALYN EDDIEn 1996.  She also status post left common and external iliac artery angioplasty and stenting with Dr. Edilia Bo in 2008. Her PCP ordered a carotid Duplex and pt states she got the results last week in [place]; son states she had 50% blockage on one side and 60% on the other side. About 6 weeks ago she had transient loss of vision in left eye. Walks about 5-10 feet and both calves "begin to squeeze", left worse than right, denies non healing wounds. Sometimes claudication starts in left foot and travels up left groin, rarely has rest pain. May 11, 2013 she had a CT of some sort at [facility].  The patient reports New Medical or Surgical History: was at [facility] for UTI, 3 weeks after she was discharged from her CABG. She was told by her neurologist that she has never had a stroke, but had aphasia like symptoms during her UTI.   Pt Diabetic: Yes, states her last A1C was 6.0, down from 12 per pt.  Pt smoker: former smoker, quit in May, 2015 when she had an MI and CABG x3 vessels, done at [facility]  Pt meds include: Statin :Yes Betablocker: Yes ASA: Yes Other anticoagulants/antiplatelets: Plavix  Past Medical History  Diagnosis Date  . Diabetes mellitus without complication   . Hypertension   . Hyperlipidemia   . Peripheral vascular disease   . Peripheral arterial disease   . Post PTCA Oct. 1995  . Thyroid disease 04/13/94  . Fracture closed, humerus July 2014    right humerus and right scapula  . Heart attack 1989, 1992, May 2015  . CHF (congestive heart failure)   . COPD (chronic obstructive pulmonary disease)   . CAD (coronary artery disease)   . Stroke 2002    Social History History  Substance Use Topics  . Smoking  status: Former Smoker -- 1.00 packs/day    Types: Cigarettes    Quit date: 05/11/2013  . Smokeless tobacco: Never Used  . Alcohol Use: No    Family History Family History  Problem Relation Age of Onset  . Adopted: Yes  . Family history unknown: Yes    Past Surgical History  Procedure Laterality Date  . Rotater cuff    . Anterior cruciate ligament repair    . Abdominal hysterectomy    . Femoral bypass  April 13, 1994    RIGHT  Fem-Pop BPG  by Dr. Arbie Cookey  . Stents  Aug. 4, 2008    LEFT Common iliac and external iliac artery PTA /Stents by Dr. Edilia Bo.  . Carpal tunnel release      Bilateral Hands  . Bunionectomy      Allergies  Allergen Reactions  . Lyrica [Pregabalin] Shortness Of Breath    Chest pain, difficulty breathing    Current Outpatient Prescriptions  Medication Sig Dispense Refill  . amLODipine-benazepril (LOTREL) 5-20 MG per capsule Take 1 capsule by mouth daily.      Marland Kitchen aspirin 81 MG tablet Take 81 mg by mouth daily.      Marland Kitchen atenolol (TENORMIN) 50 MG tablet Take 50 mg by mouth daily.      Marland Kitchen buPROPion (WELLBUTRIN SR) 150 MG 12 hr tablet Take 150 mg by mouth 2 (two) times daily.      Marland Kitchen  clopidogrel (PLAVIX) 75 MG tablet Take 75 mg by mouth daily.      . diazepam (VALIUM) 2 MG tablet Take 2 mg by mouth every 6 (six) hours as needed.      . DULoxetine (CYMBALTA) 60 MG capsule Take 60 mg by mouth daily.      Marland Kitchen esomeprazole (NEXIUM) 40 MG capsule Take 40 mg by mouth daily before breakfast.      . Eszopiclone (ESZOPICLONE) 3 MG TABS Take 3 mg by mouth at bedtime. Take immediately before bedtime      . ezetimibe (ZETIA) 10 MG tablet Take 10 mg by mouth daily.      . Glucose Blood (PRECISION QID TEST VI) by In Vitro route.      . Insulin Glargine (LANTUS SOLOSTAR [place]) Inject 60 Units into the skin 2 (two) times daily.      . Lancets MISC by Does not apply route.      Marland Kitchen levothyroxine (SYNTHROID, LEVOTHROID) 125 MCG tablet Take 125 mcg by mouth daily.      . Liraglutide  (VICTOZA [place]) Inject 1.2 mg into the skin.      . metFORMIN (GLUCOPHAGE) 1000 MG tablet Take 1,000 mg by mouth 2 (two) times daily with a meal.      . nitroGLYCERIN (NITROSTAT) 0.6 MG SL tablet Place 0.6 mg under the tongue every 5 (five) minutes as needed for chest pain.      Marland Kitchen rOPINIRole (REQUIP) 2 MG tablet Take 2 mg by mouth at bedtime.      . rosuvastatin (CRESTOR) 10 MG tablet Take 10 mg by mouth daily.      . tizanidine (ZANAFLEX) 6 MG capsule Take 6 mg by mouth 3 (three) times daily.       No current facility-administered medications for this visit.    ROS: See HPI for pertinent positives and negatives.   Physical Examination  Filed Vitals:   09/10/13 1212  BP: 161/77  Pulse: 75  Resp: 16  Height:  (1.549 m)  Weight: 161 lb (73.029 kg)  SpO2: 97%   Body mass index is 30.44 kg/(m^2).  General: A&O x 3, WDWN, obese female. Gait: limp, using cane Eyes: PERRLA. Pulmonary: CTAB, without wheezes , rales or rhonchi. Cardiac: regular Rythm , without detected murmur.         Carotid Bruits Right Left   Negative Negative  Aorta is not palpable. Radial pulses: 1+ bilaterally                           VASCULAR EXAM: Extremities without ischemic changes  without Gangrene; without open wounds.                                                                                                          LE Pulses Right Left       FEMORAL  1+ palpable  not palpable        POPLITEAL  not palpable   not palpable       POSTERIOR  TIBIAL  not palpable, monophasic by Doppler   not palpable, monophasic by Doppler        DORSALIS PEDIS      ANTERIOR TIBIAL not palpable, monophasic by Doppler  monophasic by Doppler and not palpable        PERONEAL monophasic by Doppler and not Palpable   monophasic by Doppler and not Palpable    Abdomen: soft, NT, no masses. Skin: no rashes, no ulcers noted. Musculoskeletal: no muscle wasting or atrophy.  Neurologic: A&O X 3; some word  searching, Appropriate Affect ; SENSATiON: normal; MOTOR FUNCTION:  moving all extremities equally, motor strength 5/5 in upper extremities, 4/5 in lower extremities. Speech is fluent/aphasic. CN 2-12 intact, photosensitivity in right eye.    Non-Invasive Vascular Imaging: DATE: 09/10/2013 LOWER EXTREMITY ARTERIAL DUPLEX EVALUATION    INDICATION: Follow up bypass graft    PREVIOUS INTERVENTION(S): Left CIA / EIA PTA stent 08/14/2006; Right femoral to below knee popliteal bypass graft 1996    DUPLEX EXAM:     RIGHT  LEFT   Peak Systolic Velocity (cm/s) Ratio (if abnormal) Waveform  Peak Systolic Velocity (cm/s) Ratio (if abnormal) Waveform  513  Stenotic Inflow Artery     253  M Proximal Anastomosis     33  B damp Proximal Graft     18  M Mid Graft     23  M  Distal Graft     54  M Distal Anastomosis     40  M Outflow Artery     .72 Today's ABI / TBI .56  .89 Previous ABI / TBI (  09/04/12) .78    Waveform:    M - Monophasic       B - Biphasic       T - Triphasic  If Ankle Brachial Index (ABI) or Toe Brachial Index (TBI) performed, please see complete report     ADDITIONAL FINDINGS:     IMPRESSION: Greater than 50% inflow disease with a patent femoral to popliteal bypass graft.    Compared to the previous exam:  Increased inflow velocity.   ILIAC ARTERY STENT EVALUATION    INDICATION: Follow up stent    PREVIOUS INTERVENTION(S): Left CIA / EIA artery PTA / stent 08/14/2006; Right femoral to below knee popliteal artery bypass graft 1996.    DUPLEX EXAM:     RIGHT  LEFT   Peak Systolic Velocity (cm/s) Ratio (if abnormal) Waveform  Peak Systolic Velocity (cm/s) Ratio (if abnormal) Waveform  53  M Aorta - Distal 53  M     Artery - Proximal to Stent 44  M     Stent - Proximal 164  M     Stent - Mid 155  M     Stent - Distal 66  M     Artery - Distal to Stent 153  M  .72 Today's ABI / TBI .56  .89 Previous ABI / TBI ( 09/04/12 ) .78    Waveform:    M - Monophasic       B -  Biphasic       T - Triphasic  If Ankle Brachial Index (ABI) or Toe Brachial Index (TBI) performed, please see complete report        ASSESSMENT: JACEY ECKERSON is a 68 y.o. female who is status post right femoral to below knee popliteal artery bypass with saphenous vein in 1996.  She also status post left common and external iliac artery angioplasty and  stenting with Dr. Edilia Bo in 2008. Her PCP ordered a carotid Duplex and pt states she got the results last week in [place]; son states she had 50% blockage on one side and 60% on the other side. About 6 weeks ago she had transient loss of vision in left eye.   Dr. Arbie Cookey reviewed carotid Duplex results from [facility] and chest CT images. Will address PAD stenoses after symptomatic internal carotid artery stenoses.  See Plan.  PLAN:  I discussed in depth with the patient the nature of atherosclerosis, and emphasized the importance of maximal medical management including strict control of blood pressure, blood glucose, and lipid levels, obtaining regular exercise, and continued cessation of smoking.  The patient is aware that without maximal medical management the underlying atherosclerotic disease process will progress, limiting the benefit of any interventions. [place]  Based on the patient's vascular studies and examination, pt will be scheduled for CTA of neck at [facility]  and follow up with Dr. Arbie Cookey. Follow up with Dr. Loleta Chance, her neurologist.  The patient was given information about PAD including signs, symptoms, treatment, what symptoms should prompt the patient to seek immediate medical care, and risk reduction measures to take.  Charisse March, RN, MSN, FNP-C [facility] [place] Phone: [phone]  Clinic MD: Early  09/10/2013 12:20 PM

## 2013-09-11 ENCOUNTER — Telehealth: Payer: Self-pay | Admitting: Vascular Surgery

## 2013-09-11 NOTE — Telephone Encounter (Signed)
patient is aware that she has an appointment with dr. Loleta Chance on 10-01-13 for fu as per suzanne's dictation on 09-10-13

## 2013-09-20 ENCOUNTER — Other Ambulatory Visit (HOSPITAL_COMMUNITY): Payer: Medicare Other

## 2013-09-23 ENCOUNTER — Encounter: Payer: Self-pay | Admitting: Vascular Surgery

## 2013-09-24 ENCOUNTER — Ambulatory Visit (INDEPENDENT_AMBULATORY_CARE_PROVIDER_SITE_OTHER): Payer: Medicare Other | Admitting: Vascular Surgery

## 2013-09-24 ENCOUNTER — Encounter: Payer: Self-pay | Admitting: Vascular Surgery

## 2013-09-24 VITALS — BP 131/78 | HR 74 | Resp 16 | Ht 61.0 in | Wt 161.0 lb

## 2013-09-24 DIAGNOSIS — Z48812 Encounter for surgical aftercare following surgery on the circulatory system: Secondary | ICD-10-CM

## 2013-09-24 DIAGNOSIS — I6529 Occlusion and stenosis of unspecified carotid artery: Secondary | ICD-10-CM

## 2013-09-24 DIAGNOSIS — I739 Peripheral vascular disease, unspecified: Secondary | ICD-10-CM

## 2013-09-24 NOTE — Progress Notes (Signed)
Patient presents today for discussion of her recent CT angiogram that [facility]. I had seen her with duplex suggesting moderate carotid disease. She had visual and neurologic events with some confusion and some unsteady gait. Today with her son for discussion of this. They're quite concerned regarding her ongoing episodes of confusion. No focal neurologic deficits  Past Medical History  Diagnosis Date  . Diabetes mellitus without complication   . Hypertension   . Hyperlipidemia   . Peripheral vascular disease   . Peripheral arterial disease   . Post PTCA Oct. 1995  . Thyroid disease 04/13/94  . Fracture closed, humerus July 2014    right humerus and right scapula  . Heart attack 1989, 1992, May 2015  . CHF (congestive heart failure)   . COPD (chronic obstructive pulmonary disease)   . CAD (coronary artery disease)   . Stroke 2002    History  Substance Use Topics  . Smoking status: Former Smoker -- 1.00 packs/day    Types: Cigarettes    Quit date: 05/11/2013  . Smokeless tobacco: Never Used  . Alcohol Use: No    Family History  Problem Relation Age of Onset  . Adopted: Yes    Allergies  Allergen Reactions  . Lyrica [Pregabalin] Shortness Of Breath    Chest pain, difficulty breathing    Current outpatient prescriptions:amLODipine-benazepril (LOTREL) 5-20 MG per capsule, Take 1 capsule by mouth daily., Disp: , Rfl: ;  aspirin 81 MG tablet, Take 81 mg by mouth daily., Disp: , Rfl: ;  atenolol (TENORMIN) 50 MG tablet, Take 50 mg by mouth daily., Disp: , Rfl: ;  buPROPion (WELLBUTRIN SR) 150 MG 12 hr tablet, Take 150 mg by mouth 2 (two) times daily., Disp: , Rfl:  clopidogrel (PLAVIX) 75 MG tablet, Take 75 mg by mouth daily., Disp: , Rfl: ;  diazepam (VALIUM) 2 MG tablet, Take 2 mg by mouth every 6 (six) hours as needed., Disp: , Rfl: ;  DULoxetine (CYMBALTA) 60 MG capsule, Take 60 mg by mouth daily., Disp: , Rfl: ;  esomeprazole (NEXIUM) 40 MG capsule, Take 40 mg by mouth  daily before breakfast., Disp: , Rfl:  Eszopiclone (ESZOPICLONE) 3 MG TABS, Take 3 mg by mouth at bedtime. Take immediately before bedtime, Disp: , Rfl: ;  ezetimibe (ZETIA) 10 MG tablet, Take 10 mg by mouth daily., Disp: , Rfl: ;  Glucose Blood (PRECISION QID TEST VI), by In Vitro route., Disp: , Rfl: ;  Insulin Glargine (LANTUS SOLOSTAR [place]), Inject 60 Units into the skin 2 (two) times daily., Disp: , Rfl: ;  Lancets MISC, by Does not apply route., Disp: , Rfl:  levothyroxine (SYNTHROID, LEVOTHROID) 125 MCG tablet, Take 125 mcg by mouth daily., Disp: , Rfl: ;  Liraglutide (VICTOZA [place]), Inject 1.2 mg into the skin., Disp: , Rfl: ;  metFORMIN (GLUCOPHAGE) 1000 MG tablet, Take 1,000 mg by mouth 2 (two) times daily with a meal., Disp: , Rfl: ;  nitroGLYCERIN (NITROSTAT) 0.6 MG SL tablet, Place 0.6 mg under the tongue every 5 (five) minutes as needed for chest pain., Disp: , Rfl:  rOPINIRole (REQUIP) 2 MG tablet, Take 2 mg by mouth at bedtime., Disp: , Rfl: ;  rosuvastatin (CRESTOR) 10 MG tablet, Take 10 mg by mouth daily., Disp: , Rfl: ;  tizanidine (ZANAFLEX) 6 MG capsule, Take 6 mg by mouth 3 (three) times daily., Disp: , Rfl:   BP 131/78  Pulse 74  Resp 16  Ht  (1.549 m)  Wt  161 lb (73.029 kg)  BMI 30.44 kg/m2  SpO2 99%  Body mass index is 30.44 kg/(m^2).       Physical exam is unchanged  I did review her CT angiogram and specific films from [facility]. 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 She has a very irregular stenosis over a long segment of her right vertebral artery as well with subtotal occlusion. She also has severe intracranial vertebral artery disease. Carotid arteries show a plaque was no flow-limiting stenosis   Discussed this at length with the patient and her son present. No evidence of surgically correctable disease. They are obviously concerned about regarding progressive neurologic  difficulty. They will continue to discuss this with Dr. Loleta Chance, with her neurologist. All see her again in one year with duplex to rule out progressive carotid disease and also followup of her lower extremity he moderate arterial insufficiency causing claudication

## 2013-09-25 NOTE — Addendum Note (Signed)
Addended by: Sharee Pimple on: 09/25/2013 11:37 AM   Modules accepted: Orders

## 2013-10-10 ENCOUNTER — Encounter: Payer: Self-pay | Admitting: Vascular Surgery

## 2014-06-11 DEATH — deceased

## 2014-09-30 ENCOUNTER — Ambulatory Visit: Payer: Medicare Other | Admitting: Vascular Surgery

## 2014-09-30 ENCOUNTER — Encounter (HOSPITAL_COMMUNITY): Payer: Medicare Other

## 2014-11-18 ENCOUNTER — Ambulatory Visit: Payer: Medicare Other | Admitting: Vascular Surgery

## 2014-11-18 ENCOUNTER — Encounter (HOSPITAL_COMMUNITY): Payer: Medicare Other
